# Patient Record
Sex: Male | Born: 1945
Health system: Southern US, Community
[De-identification: ages and names within clinical notes are randomized; demographics above are authoritative.]

---

## 2008-07-04 ENCOUNTER — Ambulatory Visit (HOSPITAL_COMMUNITY): Admission: RE | Admit: 2008-07-04 | Discharge: 2008-07-05 | Payer: Self-pay | Admitting: Ophthalmology

## 2010-01-07 IMAGING — CR DG CHEST 2V
2 series · 2 of 2 positions shown · non-contrast
Comparison: None

CLINICAL DATA: Glaucoma with high intraocular pressure.  Diabetes
and hypertension.  Preop respiratory exam.

CHEST - 2 VIEW

[view not recorded (1 of 2)]
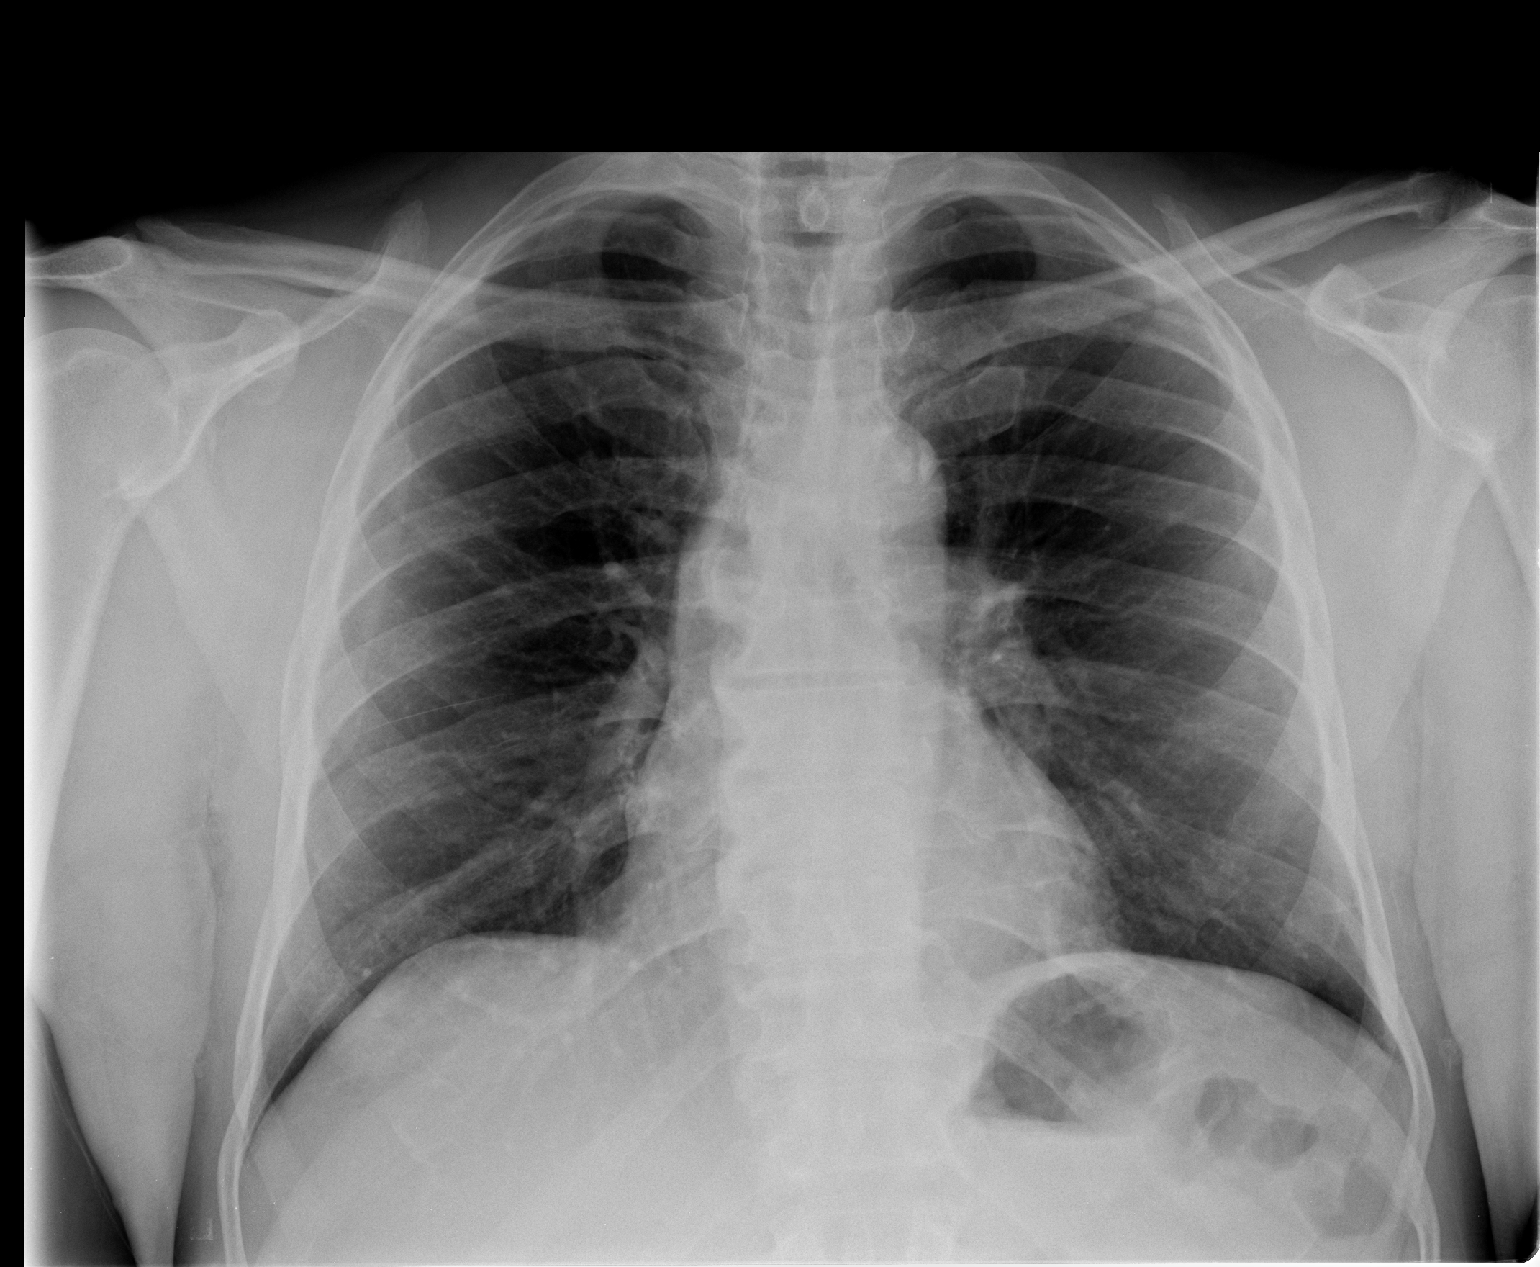

[view not recorded (2 of 2)]
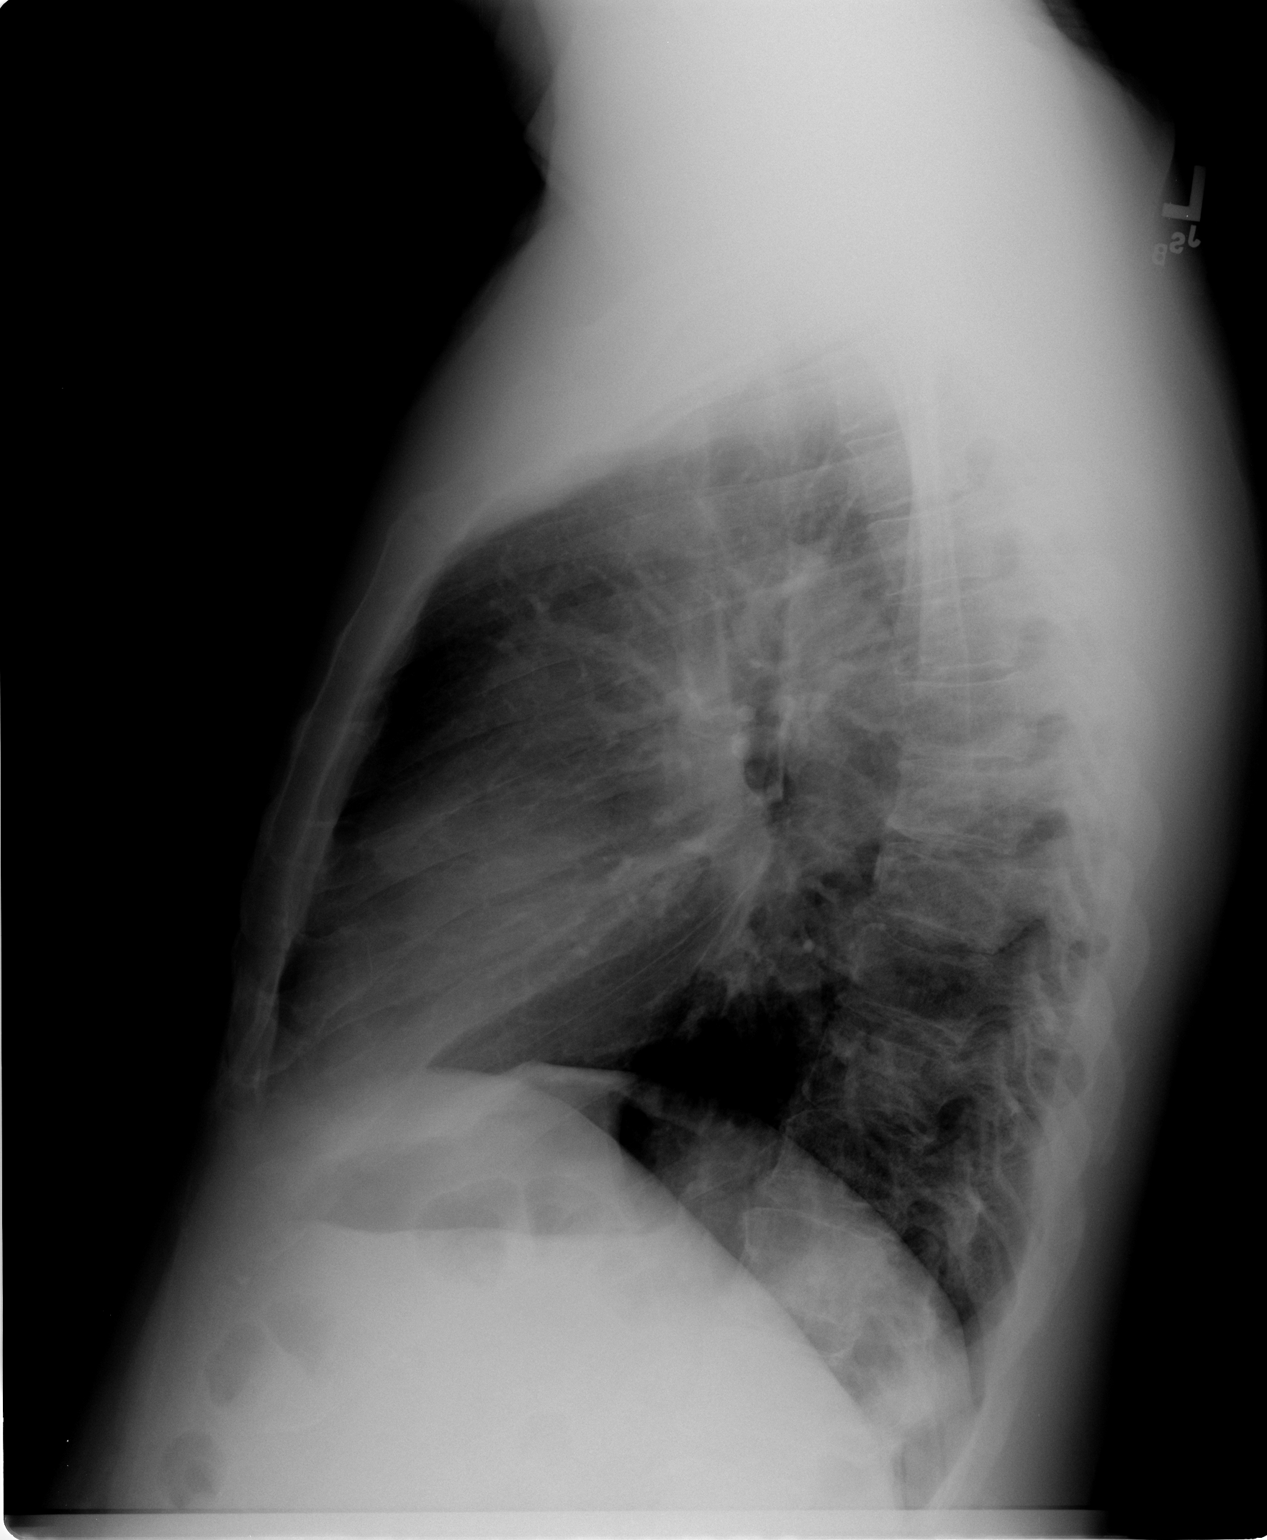

[2 of 2 positions shown; findings below may reference images not displayed]

FINDINGS: Heart size and mediastinal contours are normal.  Both
lungs are clear.  There is no evidence of pleural effusion.  No
mass or adenopathy identified.
IMPRESSION: No active disease.

## 2011-02-16 NOTE — Op Note (Signed)
NAMEJOAH, Devon Perez                  ACCOUNT NO.:  [id number]   MEDICAL RECORD NO.:  [id number]          PATIENT TYPE:  AMB   LOCATION:  SDS                          FACILITY:  [facility]   PHYSICIAN:  Chalmers Guest, M.D.     DATE OF BIRTH:  Mar 23, 1946   DATE OF PROCEDURE:  07/04/2008  DATE OF DISCHARGE:                               OPERATIVE REPORT   PREOPERATIVE DIAGNOSIS:  Uncontrolled glaucoma and visually significant  cataract, right eye.   POSTOPERATIVE DIAGNOSIS:  Uncontrolled glaucoma and visually significant  cataract, right eye.  The patient's right eye is his only seeing eye.  He has a prosthetic left eye.   PROCEDURE:  Mini Ex-PRESS glaucoma shunt with mitomycin-C and a  phacoemulsification with intraocular lens implant, right eye.   COMPLICATIONS:  None.   ANESTHESIA:  Consisted of 2% Xylocaine in a 50:50 mixture with 0.75%  Marcaine with an ampule of Wydase and epinephrine.   PROCEDURE IN DETAIL:  The patient was transported to the operating room  where a peribulbar block was given with the aforementioned local  anesthetic agent.  Following this, the patient's face was prepped and  draped in the usual sterile fashion with the surgeon sitting temporally.  The Weck-cel sponge was used to fixate the globe and a 15-degree blade  was used to enter through clear cornea at the 11 o'clock position.  Viscoat was then injected in the eye and additional Weck-cel sponge was  used to fixate the globe and a 2.75 mm keratome blade was used in a  stepwise fashion through the clear cornea temporally.  Additional  viscoelastic was injected.  Following this, a bent 25-gauge needle was  used to incise the anterior capsule and a curvilinear capsulorrhexis was  performed.  BSS was used to hydrodissect the nucleus and hydrodelineate  the nucleus.  The nucleus partly prolapsed out of the capsular bag.  The  phacoemulsification unit was then used to sculpt a pole and then the  epinuclear shell  was removed from the eye with the posterior capsule  remaining intact.  The I/A was then used to strip the cortical fibers  and the epinucleus from the rest of the remaining cortical fibers from  the posterior capsule and olive-tipped polisher was used to polish the  posterior capsule.  Following this, the intraocular lens implant was  examined and noted to have no defects.  The lens was an Alcon AcrySof  SN60WF 18.5 diopter lens.  It was placed in the lens shooter.  The  incision had to be slightly enlarged and then the lens shooter was  placed in the incision and the lens was injected into the eye.  It  unfolded behind the anterior capsular leaves.  The Kuglen hook was used  to position the lens.  The I/A was used to remove viscoelastic and  remaining cortical particles from the eye.  A single 10-0 nylon suture  was used to secure the incision that was buried in the clear cornea.  Following this, the operating microscope was positioned superiorly with  the surgeon rotating to the  superior position.  Using the [facility]  forceps, the conjunctiva was grasped and a blunt Westcott scissors were  used to incise the conjunctiva in the superior nasal quadrant.  Forming  a fornix-based conjunctival flap, blunt dissection was carried out  posteriorly.  A Tooke blade was used to resect the Tenon tissue.  There  was bleeding which was controlled with cautery.  Following this, a 45-  degree blade was used to fashion a half-thickness scleral flap.  The  0.12 was used to elevate the tips of the flap and Colibri forceps were  used.  A 5700 Grieshaber blade was used to dissect the scleral flap to  the limbus.  Bleeding again was controlled with cautery.  Following  this, mitomycin-C 0.4 mg/mL was placed on a Gelfoam sponge which was  placed under the conjunctiva and allowed to stay on the eye for 2.5  minutes.  It was then removed and irrigated with 60 mL of balanced salt  solution.  At this point, a  26-gauge needle on Provisc was then injected  in the eye.  After additional Provisc had been injected through the  paracentesis tract, using the 26-gauge needle under the scleral flap at  the limbus, the needle entered the eye and a small amount of Provisc was  injected.  This was removed and the mini shunt was then examined.  The  shunt was loosened on the wire.  Following this, the shunt which was Ex-  PRESS preloaded on EDSD-50, lot [id number], SA #[id number].  The shunt was  rotated and passed under the scleral flap through the stab incision that  had been made with the 26-gauge needle.  The shunt was placed and using  the forceps to fixate it, it was released easily.  The scleral flap was  then sutured with 4 interrupted 10-0 nylon sutures.  Following this, the  conjunctiva was resutured to the limbus using a 9-0 nylon on a BV-100  needle.  It was secured on both corners and sutured tightly.  BSS was  injected through the paracentesis site to burp out the remaining Provisc  and viscoelastic from the eye.  The incision was checked for leakage and  there was no leakage noted.  A subconjunctival injection of Kenalog 4 mg  was given at the 5:30 position.  Topical TobraDex ointment was applied  to the eye.  A patch and Fox shield were placed and the patient returned  to the recovery area in stable condition.      Chalmers Guest, M.D.  Electronically Signed     RW/MEDQ  D:  07/04/2008  T:  07/04/2008  Job:  [id number]   cc:   Fax #:  [phone]

## 2011-07-05 LAB — BASIC METABOLIC PANEL
BUN: 13
Calcium: 9.8
Creatinine, Ser: 1.1
GFR calc Af Amer: >60
GFR calc non Af Amer: >60

## 2011-07-05 LAB — CBC
MCV: 78.6
Platelets: 252
WBC: 5.8

## 2011-07-06 LAB — GLUCOSE, CAPILLARY
Glucose-Capillary: 108 — ABNORMAL HIGH
Glucose-Capillary: 125 — ABNORMAL HIGH
Glucose-Capillary: 131 — ABNORMAL HIGH
Glucose-Capillary: 215 — ABNORMAL HIGH
Glucose-Capillary: 239 — ABNORMAL HIGH

## 2015-10-17 DIAGNOSIS — I129 Hypertensive chronic kidney disease with stage 1 through stage 4 chronic kidney disease, or unspecified chronic kidney disease: Secondary | ICD-10-CM | POA: Diagnosis not present

## 2015-10-17 DIAGNOSIS — Z1211 Encounter for screening for malignant neoplasm of colon: Secondary | ICD-10-CM | POA: Diagnosis not present

## 2015-10-17 DIAGNOSIS — Z23 Encounter for immunization: Secondary | ICD-10-CM | POA: Diagnosis not present

## 2015-10-17 DIAGNOSIS — N183 Chronic kidney disease, stage 3 (moderate): Secondary | ICD-10-CM | POA: Diagnosis not present

## 2015-10-17 DIAGNOSIS — E559 Vitamin D deficiency, unspecified: Secondary | ICD-10-CM | POA: Diagnosis not present

## 2015-10-17 DIAGNOSIS — E785 Hyperlipidemia, unspecified: Secondary | ICD-10-CM | POA: Diagnosis not present

## 2015-10-17 DIAGNOSIS — Z136 Encounter for screening for cardiovascular disorders: Secondary | ICD-10-CM | POA: Diagnosis not present

## 2015-10-17 DIAGNOSIS — M109 Gout, unspecified: Secondary | ICD-10-CM | POA: Diagnosis not present

## 2015-10-17 DIAGNOSIS — Z6835 Body mass index (BMI) 35.0-35.9, adult: Secondary | ICD-10-CM | POA: Diagnosis not present

## 2015-10-17 DIAGNOSIS — Z0000 Encounter for general adult medical examination without abnormal findings: Secondary | ICD-10-CM | POA: Diagnosis not present

## 2015-10-17 DIAGNOSIS — E669 Obesity, unspecified: Secondary | ICD-10-CM | POA: Diagnosis not present

## 2015-10-17 DIAGNOSIS — E1122 Type 2 diabetes mellitus with diabetic chronic kidney disease: Secondary | ICD-10-CM | POA: Diagnosis not present

## 2015-10-17 DIAGNOSIS — Z79899 Other long term (current) drug therapy: Secondary | ICD-10-CM | POA: Diagnosis not present

## 2015-10-17 DIAGNOSIS — Z125 Encounter for screening for malignant neoplasm of prostate: Secondary | ICD-10-CM | POA: Diagnosis not present

## 2015-10-21 DIAGNOSIS — E785 Hyperlipidemia, unspecified: Secondary | ICD-10-CM | POA: Diagnosis not present

## 2015-10-21 DIAGNOSIS — M109 Gout, unspecified: Secondary | ICD-10-CM | POA: Diagnosis not present

## 2015-10-21 DIAGNOSIS — E1122 Type 2 diabetes mellitus with diabetic chronic kidney disease: Secondary | ICD-10-CM | POA: Diagnosis not present

## 2015-10-21 DIAGNOSIS — N183 Chronic kidney disease, stage 3 (moderate): Secondary | ICD-10-CM | POA: Diagnosis not present

## 2015-10-21 DIAGNOSIS — H540 Blindness, both eyes: Secondary | ICD-10-CM | POA: Diagnosis not present

## 2015-10-21 DIAGNOSIS — I129 Hypertensive chronic kidney disease with stage 1 through stage 4 chronic kidney disease, or unspecified chronic kidney disease: Secondary | ICD-10-CM | POA: Diagnosis not present

## 2015-10-21 DIAGNOSIS — Z0000 Encounter for general adult medical examination without abnormal findings: Secondary | ICD-10-CM | POA: Diagnosis not present

## 2015-10-21 DIAGNOSIS — E669 Obesity, unspecified: Secondary | ICD-10-CM | POA: Diagnosis not present

## 2015-10-27 DIAGNOSIS — E113291 Type 2 diabetes mellitus with mild nonproliferative diabetic retinopathy without macular edema, right eye: Secondary | ICD-10-CM | POA: Diagnosis not present

## 2015-10-27 DIAGNOSIS — H26491 Other secondary cataract, right eye: Secondary | ICD-10-CM | POA: Diagnosis not present

## 2015-10-27 DIAGNOSIS — H401113 Primary open-angle glaucoma, right eye, severe stage: Secondary | ICD-10-CM | POA: Diagnosis not present

## 2015-12-09 DIAGNOSIS — M65342 Trigger finger, left ring finger: Secondary | ICD-10-CM | POA: Diagnosis not present

## 2015-12-22 DIAGNOSIS — H401113 Primary open-angle glaucoma, right eye, severe stage: Secondary | ICD-10-CM | POA: Diagnosis not present

## 2016-01-19 DIAGNOSIS — I129 Hypertensive chronic kidney disease with stage 1 through stage 4 chronic kidney disease, or unspecified chronic kidney disease: Secondary | ICD-10-CM | POA: Diagnosis not present

## 2016-01-19 DIAGNOSIS — N183 Chronic kidney disease, stage 3 (moderate): Secondary | ICD-10-CM | POA: Diagnosis not present

## 2016-01-26 DIAGNOSIS — I129 Hypertensive chronic kidney disease with stage 1 through stage 4 chronic kidney disease, or unspecified chronic kidney disease: Secondary | ICD-10-CM | POA: Diagnosis not present

## 2016-01-26 DIAGNOSIS — Z794 Long term (current) use of insulin: Secondary | ICD-10-CM | POA: Diagnosis not present

## 2016-01-26 DIAGNOSIS — N183 Chronic kidney disease, stage 3 (moderate): Secondary | ICD-10-CM | POA: Diagnosis not present

## 2016-01-26 DIAGNOSIS — E1122 Type 2 diabetes mellitus with diabetic chronic kidney disease: Secondary | ICD-10-CM | POA: Diagnosis not present

## 2016-03-02 DIAGNOSIS — E1122 Type 2 diabetes mellitus with diabetic chronic kidney disease: Secondary | ICD-10-CM | POA: Diagnosis not present

## 2016-03-02 DIAGNOSIS — N183 Chronic kidney disease, stage 3 (moderate): Secondary | ICD-10-CM | POA: Diagnosis not present

## 2016-03-02 DIAGNOSIS — E782 Mixed hyperlipidemia: Secondary | ICD-10-CM | POA: Diagnosis not present

## 2016-03-02 DIAGNOSIS — N400 Enlarged prostate without lower urinary tract symptoms: Secondary | ICD-10-CM | POA: Diagnosis not present

## 2016-03-02 DIAGNOSIS — Z79899 Other long term (current) drug therapy: Secondary | ICD-10-CM | POA: Diagnosis not present

## 2016-03-02 DIAGNOSIS — M109 Gout, unspecified: Secondary | ICD-10-CM | POA: Diagnosis not present

## 2016-03-02 DIAGNOSIS — Z794 Long term (current) use of insulin: Secondary | ICD-10-CM | POA: Diagnosis not present

## 2016-03-02 DIAGNOSIS — I129 Hypertensive chronic kidney disease with stage 1 through stage 4 chronic kidney disease, or unspecified chronic kidney disease: Secondary | ICD-10-CM | POA: Diagnosis not present

## 2016-03-02 DIAGNOSIS — E559 Vitamin D deficiency, unspecified: Secondary | ICD-10-CM | POA: Diagnosis not present

## 2016-03-18 DIAGNOSIS — M109 Gout, unspecified: Secondary | ICD-10-CM | POA: Diagnosis not present

## 2016-06-03 DIAGNOSIS — I129 Hypertensive chronic kidney disease with stage 1 through stage 4 chronic kidney disease, or unspecified chronic kidney disease: Secondary | ICD-10-CM | POA: Diagnosis not present

## 2016-06-03 DIAGNOSIS — E559 Vitamin D deficiency, unspecified: Secondary | ICD-10-CM | POA: Diagnosis not present

## 2016-06-03 DIAGNOSIS — M1A9XX0 Chronic gout, unspecified, without tophus (tophi): Secondary | ICD-10-CM | POA: Diagnosis not present

## 2016-06-03 DIAGNOSIS — E782 Mixed hyperlipidemia: Secondary | ICD-10-CM | POA: Diagnosis not present

## 2016-06-03 DIAGNOSIS — Z87898 Personal history of other specified conditions: Secondary | ICD-10-CM | POA: Diagnosis not present

## 2016-06-03 DIAGNOSIS — E1122 Type 2 diabetes mellitus with diabetic chronic kidney disease: Secondary | ICD-10-CM | POA: Diagnosis not present

## 2016-06-03 DIAGNOSIS — N183 Chronic kidney disease, stage 3 (moderate): Secondary | ICD-10-CM | POA: Diagnosis not present

## 2016-06-03 DIAGNOSIS — Z794 Long term (current) use of insulin: Secondary | ICD-10-CM | POA: Diagnosis not present

## 2016-06-03 DIAGNOSIS — N400 Enlarged prostate without lower urinary tract symptoms: Secondary | ICD-10-CM | POA: Diagnosis not present

## 2016-06-14 DIAGNOSIS — H401113 Primary open-angle glaucoma, right eye, severe stage: Secondary | ICD-10-CM | POA: Diagnosis not present

## 2016-08-05 DIAGNOSIS — H401113 Primary open-angle glaucoma, right eye, severe stage: Secondary | ICD-10-CM | POA: Diagnosis not present

## 2016-08-05 DIAGNOSIS — E119 Type 2 diabetes mellitus without complications: Secondary | ICD-10-CM | POA: Diagnosis not present

## 2016-08-18 DIAGNOSIS — H409 Unspecified glaucoma: Secondary | ICD-10-CM | POA: Diagnosis not present

## 2016-08-18 DIAGNOSIS — H401113 Primary open-angle glaucoma, right eye, severe stage: Secondary | ICD-10-CM | POA: Diagnosis not present

## 2016-09-29 DIAGNOSIS — N183 Chronic kidney disease, stage 3 (moderate): Secondary | ICD-10-CM | POA: Diagnosis not present

## 2016-09-29 DIAGNOSIS — E559 Vitamin D deficiency, unspecified: Secondary | ICD-10-CM | POA: Diagnosis not present

## 2016-09-29 DIAGNOSIS — M109 Gout, unspecified: Secondary | ICD-10-CM | POA: Diagnosis not present

## 2016-09-29 DIAGNOSIS — I129 Hypertensive chronic kidney disease with stage 1 through stage 4 chronic kidney disease, or unspecified chronic kidney disease: Secondary | ICD-10-CM | POA: Diagnosis not present

## 2016-09-29 DIAGNOSIS — Z23 Encounter for immunization: Secondary | ICD-10-CM | POA: Diagnosis not present

## 2016-09-29 DIAGNOSIS — N400 Enlarged prostate without lower urinary tract symptoms: Secondary | ICD-10-CM | POA: Diagnosis not present

## 2016-09-29 DIAGNOSIS — E782 Mixed hyperlipidemia: Secondary | ICD-10-CM | POA: Diagnosis not present

## 2016-09-29 DIAGNOSIS — E1122 Type 2 diabetes mellitus with diabetic chronic kidney disease: Secondary | ICD-10-CM | POA: Diagnosis not present

## 2016-09-29 DIAGNOSIS — Z794 Long term (current) use of insulin: Secondary | ICD-10-CM | POA: Diagnosis not present

## 2016-10-13 DIAGNOSIS — M79645 Pain in left finger(s): Secondary | ICD-10-CM | POA: Diagnosis not present

## 2016-10-13 DIAGNOSIS — M65312 Trigger thumb, left thumb: Secondary | ICD-10-CM | POA: Diagnosis not present

## 2016-12-09 DIAGNOSIS — H401113 Primary open-angle glaucoma, right eye, severe stage: Secondary | ICD-10-CM | POA: Diagnosis not present

## 2016-12-28 DIAGNOSIS — K1379 Other lesions of oral mucosa: Secondary | ICD-10-CM | POA: Diagnosis not present

## 2017-01-06 DIAGNOSIS — M109 Gout, unspecified: Secondary | ICD-10-CM | POA: Diagnosis not present

## 2017-01-06 DIAGNOSIS — Z794 Long term (current) use of insulin: Secondary | ICD-10-CM | POA: Diagnosis not present

## 2017-01-06 DIAGNOSIS — E1122 Type 2 diabetes mellitus with diabetic chronic kidney disease: Secondary | ICD-10-CM | POA: Diagnosis not present

## 2017-01-06 DIAGNOSIS — Z1389 Encounter for screening for other disorder: Secondary | ICD-10-CM | POA: Diagnosis not present

## 2017-01-06 DIAGNOSIS — E559 Vitamin D deficiency, unspecified: Secondary | ICD-10-CM | POA: Diagnosis not present

## 2017-01-06 DIAGNOSIS — Z9181 History of falling: Secondary | ICD-10-CM | POA: Diagnosis not present

## 2017-01-06 DIAGNOSIS — E669 Obesity, unspecified: Secondary | ICD-10-CM | POA: Diagnosis not present

## 2017-01-06 DIAGNOSIS — N183 Chronic kidney disease, stage 3 (moderate): Secondary | ICD-10-CM | POA: Diagnosis not present

## 2017-01-06 DIAGNOSIS — E7800 Pure hypercholesterolemia, unspecified: Secondary | ICD-10-CM | POA: Diagnosis not present

## 2017-01-06 DIAGNOSIS — Z1211 Encounter for screening for malignant neoplasm of colon: Secondary | ICD-10-CM | POA: Diagnosis not present

## 2017-01-06 DIAGNOSIS — Z125 Encounter for screening for malignant neoplasm of prostate: Secondary | ICD-10-CM | POA: Diagnosis not present

## 2017-01-06 DIAGNOSIS — Z0000 Encounter for general adult medical examination without abnormal findings: Secondary | ICD-10-CM | POA: Diagnosis not present

## 2017-01-06 DIAGNOSIS — Z1212 Encounter for screening for malignant neoplasm of rectum: Secondary | ICD-10-CM | POA: Diagnosis not present

## 2017-01-10 DIAGNOSIS — H401113 Primary open-angle glaucoma, right eye, severe stage: Secondary | ICD-10-CM | POA: Diagnosis not present

## 2017-01-20 DIAGNOSIS — N183 Chronic kidney disease, stage 3 (moderate): Secondary | ICD-10-CM | POA: Diagnosis not present

## 2017-01-20 DIAGNOSIS — E782 Mixed hyperlipidemia: Secondary | ICD-10-CM | POA: Diagnosis not present

## 2017-01-20 DIAGNOSIS — M109 Gout, unspecified: Secondary | ICD-10-CM | POA: Diagnosis not present

## 2017-01-20 DIAGNOSIS — N400 Enlarged prostate without lower urinary tract symptoms: Secondary | ICD-10-CM | POA: Diagnosis not present

## 2017-01-20 DIAGNOSIS — I129 Hypertensive chronic kidney disease with stage 1 through stage 4 chronic kidney disease, or unspecified chronic kidney disease: Secondary | ICD-10-CM | POA: Diagnosis not present

## 2017-01-20 DIAGNOSIS — E559 Vitamin D deficiency, unspecified: Secondary | ICD-10-CM | POA: Diagnosis not present

## 2017-01-20 DIAGNOSIS — Z794 Long term (current) use of insulin: Secondary | ICD-10-CM | POA: Diagnosis not present

## 2017-01-20 DIAGNOSIS — E1122 Type 2 diabetes mellitus with diabetic chronic kidney disease: Secondary | ICD-10-CM | POA: Diagnosis not present

## 2017-03-16 DIAGNOSIS — Z794 Long term (current) use of insulin: Secondary | ICD-10-CM | POA: Diagnosis not present

## 2017-03-16 DIAGNOSIS — N183 Chronic kidney disease, stage 3 (moderate): Secondary | ICD-10-CM | POA: Diagnosis not present

## 2017-03-16 DIAGNOSIS — E1122 Type 2 diabetes mellitus with diabetic chronic kidney disease: Secondary | ICD-10-CM | POA: Diagnosis not present

## 2017-03-16 DIAGNOSIS — I129 Hypertensive chronic kidney disease with stage 1 through stage 4 chronic kidney disease, or unspecified chronic kidney disease: Secondary | ICD-10-CM | POA: Diagnosis not present

## 2017-03-22 DIAGNOSIS — H401113 Primary open-angle glaucoma, right eye, severe stage: Secondary | ICD-10-CM | POA: Diagnosis not present

## 2017-05-11 DIAGNOSIS — Z794 Long term (current) use of insulin: Secondary | ICD-10-CM | POA: Diagnosis not present

## 2017-05-11 DIAGNOSIS — E1122 Type 2 diabetes mellitus with diabetic chronic kidney disease: Secondary | ICD-10-CM | POA: Diagnosis not present

## 2017-05-11 DIAGNOSIS — M109 Gout, unspecified: Secondary | ICD-10-CM | POA: Diagnosis not present

## 2017-05-11 DIAGNOSIS — I129 Hypertensive chronic kidney disease with stage 1 through stage 4 chronic kidney disease, or unspecified chronic kidney disease: Secondary | ICD-10-CM | POA: Diagnosis not present

## 2017-05-11 DIAGNOSIS — N400 Enlarged prostate without lower urinary tract symptoms: Secondary | ICD-10-CM | POA: Diagnosis not present

## 2017-05-11 DIAGNOSIS — N183 Chronic kidney disease, stage 3 (moderate): Secondary | ICD-10-CM | POA: Diagnosis not present

## 2017-05-11 DIAGNOSIS — E559 Vitamin D deficiency, unspecified: Secondary | ICD-10-CM | POA: Diagnosis not present

## 2017-05-11 DIAGNOSIS — E782 Mixed hyperlipidemia: Secondary | ICD-10-CM | POA: Diagnosis not present

## 2017-05-16 DIAGNOSIS — H401113 Primary open-angle glaucoma, right eye, severe stage: Secondary | ICD-10-CM | POA: Diagnosis not present

## 2017-06-14 DIAGNOSIS — E1122 Type 2 diabetes mellitus with diabetic chronic kidney disease: Secondary | ICD-10-CM | POA: Diagnosis not present

## 2017-06-14 DIAGNOSIS — I129 Hypertensive chronic kidney disease with stage 1 through stage 4 chronic kidney disease, or unspecified chronic kidney disease: Secondary | ICD-10-CM | POA: Diagnosis not present

## 2017-06-14 DIAGNOSIS — N183 Chronic kidney disease, stage 3 (moderate): Secondary | ICD-10-CM | POA: Diagnosis not present

## 2017-06-14 DIAGNOSIS — Z794 Long term (current) use of insulin: Secondary | ICD-10-CM | POA: Diagnosis not present

## 2017-08-23 DIAGNOSIS — H401113 Primary open-angle glaucoma, right eye, severe stage: Secondary | ICD-10-CM | POA: Diagnosis not present

## 2017-09-14 DIAGNOSIS — I129 Hypertensive chronic kidney disease with stage 1 through stage 4 chronic kidney disease, or unspecified chronic kidney disease: Secondary | ICD-10-CM | POA: Diagnosis not present

## 2017-09-14 DIAGNOSIS — E1122 Type 2 diabetes mellitus with diabetic chronic kidney disease: Secondary | ICD-10-CM | POA: Diagnosis not present

## 2017-09-14 DIAGNOSIS — Z794 Long term (current) use of insulin: Secondary | ICD-10-CM | POA: Diagnosis not present

## 2017-09-14 DIAGNOSIS — E782 Mixed hyperlipidemia: Secondary | ICD-10-CM | POA: Diagnosis not present

## 2017-09-14 DIAGNOSIS — N183 Chronic kidney disease, stage 3 (moderate): Secondary | ICD-10-CM | POA: Diagnosis not present

## 2017-09-14 DIAGNOSIS — E669 Obesity, unspecified: Secondary | ICD-10-CM | POA: Diagnosis not present

## 2017-09-14 DIAGNOSIS — M109 Gout, unspecified: Secondary | ICD-10-CM | POA: Diagnosis not present

## 2017-09-14 DIAGNOSIS — N400 Enlarged prostate without lower urinary tract symptoms: Secondary | ICD-10-CM | POA: Diagnosis not present

## 2017-09-14 DIAGNOSIS — E559 Vitamin D deficiency, unspecified: Secondary | ICD-10-CM | POA: Diagnosis not present

## 2017-09-15 DIAGNOSIS — E871 Hypo-osmolality and hyponatremia: Secondary | ICD-10-CM | POA: Diagnosis not present

## 2017-09-16 DIAGNOSIS — E871 Hypo-osmolality and hyponatremia: Secondary | ICD-10-CM | POA: Diagnosis not present

## 2017-11-24 DIAGNOSIS — H40151 Residual stage of open-angle glaucoma, right eye: Secondary | ICD-10-CM | POA: Diagnosis not present

## 2017-12-08 DIAGNOSIS — N183 Chronic kidney disease, stage 3 (moderate): Secondary | ICD-10-CM | POA: Diagnosis not present

## 2017-12-12 DIAGNOSIS — N183 Chronic kidney disease, stage 3 (moderate): Secondary | ICD-10-CM | POA: Diagnosis not present

## 2017-12-12 DIAGNOSIS — I129 Hypertensive chronic kidney disease with stage 1 through stage 4 chronic kidney disease, or unspecified chronic kidney disease: Secondary | ICD-10-CM | POA: Diagnosis not present

## 2017-12-12 DIAGNOSIS — Z794 Long term (current) use of insulin: Secondary | ICD-10-CM | POA: Diagnosis not present

## 2017-12-12 DIAGNOSIS — E1122 Type 2 diabetes mellitus with diabetic chronic kidney disease: Secondary | ICD-10-CM | POA: Diagnosis not present

## 2018-02-01 DIAGNOSIS — N183 Chronic kidney disease, stage 3 (moderate): Secondary | ICD-10-CM | POA: Diagnosis not present

## 2018-02-01 DIAGNOSIS — Z794 Long term (current) use of insulin: Secondary | ICD-10-CM | POA: Diagnosis not present

## 2018-02-01 DIAGNOSIS — E1122 Type 2 diabetes mellitus with diabetic chronic kidney disease: Secondary | ICD-10-CM | POA: Diagnosis not present

## 2018-02-01 DIAGNOSIS — Z125 Encounter for screening for malignant neoplasm of prostate: Secondary | ICD-10-CM | POA: Diagnosis not present

## 2018-02-01 DIAGNOSIS — I129 Hypertensive chronic kidney disease with stage 1 through stage 4 chronic kidney disease, or unspecified chronic kidney disease: Secondary | ICD-10-CM | POA: Diagnosis not present

## 2018-03-01 DIAGNOSIS — E349 Endocrine disorder, unspecified: Secondary | ICD-10-CM | POA: Diagnosis not present

## 2018-03-01 DIAGNOSIS — N179 Acute kidney failure, unspecified: Secondary | ICD-10-CM | POA: Diagnosis not present

## 2018-03-01 DIAGNOSIS — Z743 Need for continuous supervision: Secondary | ICD-10-CM | POA: Diagnosis not present

## 2018-03-01 DIAGNOSIS — I517 Cardiomegaly: Secondary | ICD-10-CM | POA: Diagnosis not present

## 2018-03-01 DIAGNOSIS — L89152 Pressure ulcer of sacral region, stage 2: Secondary | ICD-10-CM | POA: Diagnosis not present

## 2018-03-01 DIAGNOSIS — E871 Hypo-osmolality and hyponatremia: Secondary | ICD-10-CM | POA: Diagnosis not present

## 2018-03-01 DIAGNOSIS — E1110 Type 2 diabetes mellitus with ketoacidosis without coma: Secondary | ICD-10-CM | POA: Diagnosis not present

## 2018-03-01 DIAGNOSIS — F329 Major depressive disorder, single episode, unspecified: Secondary | ICD-10-CM | POA: Diagnosis not present

## 2018-03-01 DIAGNOSIS — R5381 Other malaise: Secondary | ICD-10-CM | POA: Diagnosis not present

## 2018-03-01 DIAGNOSIS — E861 Hypovolemia: Secondary | ICD-10-CM | POA: Diagnosis not present

## 2018-03-01 DIAGNOSIS — I129 Hypertensive chronic kidney disease with stage 1 through stage 4 chronic kidney disease, or unspecified chronic kidney disease: Secondary | ICD-10-CM | POA: Diagnosis not present

## 2018-03-01 DIAGNOSIS — R9431 Abnormal electrocardiogram [ECG] [EKG]: Secondary | ICD-10-CM | POA: Diagnosis not present

## 2018-03-01 DIAGNOSIS — Z794 Long term (current) use of insulin: Secondary | ICD-10-CM | POA: Diagnosis not present

## 2018-03-01 DIAGNOSIS — N183 Chronic kidney disease, stage 3 (moderate): Secondary | ICD-10-CM | POA: Diagnosis not present

## 2018-03-01 DIAGNOSIS — I10 Essential (primary) hypertension: Secondary | ICD-10-CM | POA: Diagnosis not present

## 2018-03-01 DIAGNOSIS — R1310 Dysphagia, unspecified: Secondary | ICD-10-CM | POA: Diagnosis not present

## 2018-03-01 DIAGNOSIS — R279 Unspecified lack of coordination: Secondary | ICD-10-CM | POA: Diagnosis not present

## 2018-03-01 DIAGNOSIS — E1010 Type 1 diabetes mellitus with ketoacidosis without coma: Secondary | ICD-10-CM | POA: Diagnosis not present

## 2018-03-01 DIAGNOSIS — Z87891 Personal history of nicotine dependence: Secondary | ICD-10-CM | POA: Diagnosis not present

## 2018-03-01 DIAGNOSIS — E1122 Type 2 diabetes mellitus with diabetic chronic kidney disease: Secondary | ICD-10-CM | POA: Diagnosis not present

## 2018-03-01 DIAGNOSIS — H548 Legal blindness, as defined in USA: Secondary | ICD-10-CM | POA: Diagnosis not present

## 2018-03-01 DIAGNOSIS — H409 Unspecified glaucoma: Secondary | ICD-10-CM | POA: Diagnosis not present

## 2018-03-01 DIAGNOSIS — E1022 Type 1 diabetes mellitus with diabetic chronic kidney disease: Secondary | ICD-10-CM | POA: Diagnosis not present

## 2018-03-01 DIAGNOSIS — Z970 Presence of artificial eye: Secondary | ICD-10-CM | POA: Diagnosis not present

## 2018-03-01 DIAGNOSIS — E860 Dehydration: Secondary | ICD-10-CM | POA: Diagnosis not present

## 2018-03-01 DIAGNOSIS — E119 Type 2 diabetes mellitus without complications: Secondary | ICD-10-CM | POA: Diagnosis not present

## 2018-03-01 DIAGNOSIS — E785 Hyperlipidemia, unspecified: Secondary | ICD-10-CM | POA: Diagnosis not present

## 2018-03-01 DIAGNOSIS — D72829 Elevated white blood cell count, unspecified: Secondary | ICD-10-CM | POA: Diagnosis not present

## 2018-03-07 DIAGNOSIS — I1310 Hypertensive heart and chronic kidney disease without heart failure, with stage 1 through stage 4 chronic kidney disease, or unspecified chronic kidney disease: Secondary | ICD-10-CM | POA: Diagnosis not present

## 2018-03-07 DIAGNOSIS — I129 Hypertensive chronic kidney disease with stage 1 through stage 4 chronic kidney disease, or unspecified chronic kidney disease: Secondary | ICD-10-CM | POA: Diagnosis not present

## 2018-03-07 DIAGNOSIS — E119 Type 2 diabetes mellitus without complications: Secondary | ICD-10-CM | POA: Diagnosis not present

## 2018-03-07 DIAGNOSIS — Z794 Long term (current) use of insulin: Secondary | ICD-10-CM | POA: Diagnosis not present

## 2018-03-07 DIAGNOSIS — Z743 Need for continuous supervision: Secondary | ICD-10-CM | POA: Diagnosis not present

## 2018-03-07 DIAGNOSIS — E1122 Type 2 diabetes mellitus with diabetic chronic kidney disease: Secondary | ICD-10-CM | POA: Diagnosis not present

## 2018-03-07 DIAGNOSIS — R262 Difficulty in walking, not elsewhere classified: Secondary | ICD-10-CM | POA: Diagnosis not present

## 2018-03-07 DIAGNOSIS — E1152 Type 2 diabetes mellitus with diabetic peripheral angiopathy with gangrene: Secondary | ICD-10-CM | POA: Diagnosis not present

## 2018-03-07 DIAGNOSIS — R279 Unspecified lack of coordination: Secondary | ICD-10-CM | POA: Diagnosis not present

## 2018-03-07 DIAGNOSIS — I10 Essential (primary) hypertension: Secondary | ICD-10-CM | POA: Diagnosis not present

## 2018-03-07 DIAGNOSIS — R269 Unspecified abnormalities of gait and mobility: Secondary | ICD-10-CM | POA: Diagnosis not present

## 2018-03-07 DIAGNOSIS — E1110 Type 2 diabetes mellitus with ketoacidosis without coma: Secondary | ICD-10-CM | POA: Diagnosis not present

## 2018-03-07 DIAGNOSIS — N183 Chronic kidney disease, stage 3 (moderate): Secondary | ICD-10-CM | POA: Diagnosis not present

## 2018-03-08 DIAGNOSIS — E1152 Type 2 diabetes mellitus with diabetic peripheral angiopathy with gangrene: Secondary | ICD-10-CM | POA: Diagnosis not present

## 2018-03-08 DIAGNOSIS — I1310 Hypertensive heart and chronic kidney disease without heart failure, with stage 1 through stage 4 chronic kidney disease, or unspecified chronic kidney disease: Secondary | ICD-10-CM | POA: Diagnosis not present

## 2018-03-08 DIAGNOSIS — R262 Difficulty in walking, not elsewhere classified: Secondary | ICD-10-CM | POA: Diagnosis not present

## 2018-03-08 DIAGNOSIS — N183 Chronic kidney disease, stage 3 (moderate): Secondary | ICD-10-CM | POA: Diagnosis not present

## 2018-03-28 ENCOUNTER — Other Ambulatory Visit: Payer: Self-pay

## 2018-03-29 ENCOUNTER — Other Ambulatory Visit: Payer: Self-pay

## 2018-03-29 DIAGNOSIS — R402441 Other coma, without documented Glasgow coma scale score, or with partial score reported, in the field [EMT or ambulance]: Secondary | ICD-10-CM | POA: Diagnosis not present

## 2018-03-29 DIAGNOSIS — R4182 Altered mental status, unspecified: Secondary | ICD-10-CM | POA: Diagnosis not present

## 2018-03-29 DIAGNOSIS — E876 Hypokalemia: Secondary | ICD-10-CM | POA: Diagnosis not present

## 2018-03-29 DIAGNOSIS — E162 Hypoglycemia, unspecified: Secondary | ICD-10-CM | POA: Diagnosis not present

## 2018-03-29 DIAGNOSIS — E161 Other hypoglycemia: Secondary | ICD-10-CM | POA: Diagnosis not present

## 2018-03-29 DIAGNOSIS — E10649 Type 1 diabetes mellitus with hypoglycemia without coma: Secondary | ICD-10-CM | POA: Diagnosis not present

## 2018-03-29 DIAGNOSIS — R4020 Unspecified coma: Secondary | ICD-10-CM | POA: Diagnosis not present

## 2018-03-29 NOTE — Patient Outreach (Signed)
[facility]N) Care Management  03/29/2018  Marion DownerJames C Sawchuk 11/28/1945 [id number]  Transition of care  Referral date: 03/29/18 Referral source: [facility]18 Insurance: Health team advantage   Telephone call to patient regarding transition of care referral. HIPAA verified with patient. Explained reason for call. Patient states he was recently in the hospital due to his blood sugar being really high.  Patient states he does not fully understand everything regarding his condition. Patient states the home health agency called and cancelled services for today. States the agency said they will start on tomorrow.  Patient states he does not remember the name of the agency.  Patient unable to review medications with RNCM. Patient states his brother manages his medication but is not at home at this time to review them.  Patient reports his brother had to call 911 for him on yesterday due to his blood sugar dropping to 42.  Patient states he did go to the emergency room but was released.  He reports he is checking his blood sugars 3 times per day. Patient report his blood sugar was 525 when he went in the hospital. RNCM discussed and offered [facility]. Patient verbally agreed to transition of care follow up with community case manager.   PLAN: RNCM will refer patient to community case manager.  George InaDavina Zaryiah Barz RN,BSN,CCM [facility] Telephonic  [phone]

## 2018-03-29 NOTE — Patient Outreach (Signed)
[facility]) Care Management  03/29/2018  Marion DownerJames C Lanpher 09/29/1946 [id number]  Late entry:for  03/28/18 Transition of care  Referral date: 03/28/18 Referral source: disharged from [facility] nursing home on 03/27/18 Insurance: Health team advantage Attempt #1  Telephone call to patient regarding referral. Unable to reach patient. HIPAA compliant voice message left with call back phone number.   PLAN: RNCM will attempt 2nd telephone call to patient within 4 business days. RNCM will send outreach letter.   George InaDavina Sherrill Mckamie RN,BSN,CCM [facility] Telephonic  [phone]

## 2018-03-30 DIAGNOSIS — Z794 Long term (current) use of insulin: Secondary | ICD-10-CM | POA: Diagnosis not present

## 2018-03-30 DIAGNOSIS — N183 Chronic kidney disease, stage 3 (moderate): Secondary | ICD-10-CM | POA: Diagnosis not present

## 2018-03-30 DIAGNOSIS — Z970 Presence of artificial eye: Secondary | ICD-10-CM | POA: Diagnosis not present

## 2018-03-30 DIAGNOSIS — H548 Legal blindness, as defined in USA: Secondary | ICD-10-CM | POA: Diagnosis not present

## 2018-03-30 DIAGNOSIS — I129 Hypertensive chronic kidney disease with stage 1 through stage 4 chronic kidney disease, or unspecified chronic kidney disease: Secondary | ICD-10-CM | POA: Diagnosis not present

## 2018-03-30 DIAGNOSIS — Z9181 History of falling: Secondary | ICD-10-CM | POA: Diagnosis not present

## 2018-03-30 DIAGNOSIS — H409 Unspecified glaucoma: Secondary | ICD-10-CM | POA: Diagnosis not present

## 2018-03-30 DIAGNOSIS — E1122 Type 2 diabetes mellitus with diabetic chronic kidney disease: Secondary | ICD-10-CM | POA: Diagnosis not present

## 2018-04-03 ENCOUNTER — Other Ambulatory Visit: Payer: Self-pay

## 2018-04-03 NOTE — Patient Outreach (Signed)
[facility]N) Care Management  04/03/2018  Marion DownerJames C Mincy 10/31/1945 [id number]  Transition of care  Referral date: 03/29/18 Referral source: [facility] 03/27/18 Insurance: Health team advantage   Telephone call to patient's brother/  Caregiver, [name] as requested by patient. Verbal consent given by patient to speak with his brother regarding his health information.  HIPAA verified by brother for patient. Explained reason for call.  Completed transition of care call.  RNCM advised patient is to notify MD of any changes in condition prior to scheduled appointment. RNCM provided contact name and number: [phone] or main office number [phone] and 24 hour nurse advise line [phone].  RNCM verified aware of 911 services for urgent/ emergent needs.  PLAN; RNCM will refer patient to community case manager   George InaDavina Kelyn Ponciano RN,BSN,CCM [facility] Telephonic  [phone]

## 2018-04-04 DIAGNOSIS — N183 Chronic kidney disease, stage 3 (moderate): Secondary | ICD-10-CM | POA: Diagnosis not present

## 2018-04-04 DIAGNOSIS — I129 Hypertensive chronic kidney disease with stage 1 through stage 4 chronic kidney disease, or unspecified chronic kidney disease: Secondary | ICD-10-CM | POA: Diagnosis not present

## 2018-04-04 DIAGNOSIS — E1122 Type 2 diabetes mellitus with diabetic chronic kidney disease: Secondary | ICD-10-CM | POA: Diagnosis not present

## 2018-04-04 DIAGNOSIS — H548 Legal blindness, as defined in USA: Secondary | ICD-10-CM | POA: Diagnosis not present

## 2018-04-10 ENCOUNTER — Other Ambulatory Visit: Payer: Self-pay

## 2018-04-10 NOTE — Patient Outreach (Signed)
Transition of care call:  Placed call to patient on home, cell with no answer. Placed call on phone number  [phone] ( alternative number). Patient answered and reports that he is feeling well. Reports CBG of 160 today. Reports that he is taking his medications as prescribed. States PT comes tomorrow.  Difficult hearing patient on the phone.  Patient handed phone to Mr. Rosilyn MingsKidd.  Mr. Rosilyn MingsKidd confirms new address of [address], [place] [id number].   Offered home visit for 04/12/2018 at 11am. Patient accepted. Confirmed [facility]    PLAN: Home visit for 04/12/2018 at 11am. Confirmed new address. Sent in basket message to update chart.  Will address care plan at home visit in 2 days.  Rowe PavyAmanda Marlicia Sroka, RN, BSN, CEN [facility] Care Coordinator [phone]

## 2018-04-12 ENCOUNTER — Other Ambulatory Visit: Payer: Self-pay

## 2018-04-12 NOTE — Patient Outreach (Signed)
[facility](THN) Care Management  04/12/2018  Devon Perez 07/11/1946 [id number]   11am Arrived for initial home visit. Brother, [name] present.  Explained [facility] program as a benefit of insurance.  Provided new patient packet.  Patient reports that he is doing well and does not need [facility] services. Brother agreed.   Respectful of patients decision. Encouraged brother and or patient to call me if their change their minds. Contact card provided.  Will close case as patient is no longer interested. Will send MD a letter to update.  Patient agreed to new patient packet and living well with diabetes booklet.   Rowe PavyAmanda Joslyne Marshburn, RN, BSN, CEN [facility] Care Coordinator [phone]

## 2018-05-04 DIAGNOSIS — I129 Hypertensive chronic kidney disease with stage 1 through stage 4 chronic kidney disease, or unspecified chronic kidney disease: Secondary | ICD-10-CM | POA: Diagnosis not present

## 2018-05-04 DIAGNOSIS — Z794 Long term (current) use of insulin: Secondary | ICD-10-CM | POA: Diagnosis not present

## 2018-05-04 DIAGNOSIS — E1122 Type 2 diabetes mellitus with diabetic chronic kidney disease: Secondary | ICD-10-CM | POA: Diagnosis not present

## 2018-05-04 DIAGNOSIS — N183 Chronic kidney disease, stage 3 (moderate): Secondary | ICD-10-CM | POA: Diagnosis not present

## 2018-06-06 DIAGNOSIS — Z794 Long term (current) use of insulin: Secondary | ICD-10-CM | POA: Diagnosis not present

## 2018-06-06 DIAGNOSIS — N183 Chronic kidney disease, stage 3 (moderate): Secondary | ICD-10-CM | POA: Diagnosis not present

## 2018-06-06 DIAGNOSIS — E1122 Type 2 diabetes mellitus with diabetic chronic kidney disease: Secondary | ICD-10-CM | POA: Diagnosis not present

## 2018-06-13 DIAGNOSIS — I129 Hypertensive chronic kidney disease with stage 1 through stage 4 chronic kidney disease, or unspecified chronic kidney disease: Secondary | ICD-10-CM | POA: Diagnosis not present

## 2018-06-13 DIAGNOSIS — N183 Chronic kidney disease, stage 3 (moderate): Secondary | ICD-10-CM | POA: Diagnosis not present

## 2018-06-13 DIAGNOSIS — Z794 Long term (current) use of insulin: Secondary | ICD-10-CM | POA: Diagnosis not present

## 2018-06-13 DIAGNOSIS — E1122 Type 2 diabetes mellitus with diabetic chronic kidney disease: Secondary | ICD-10-CM | POA: Diagnosis not present

## 2018-06-21 ENCOUNTER — Other Ambulatory Visit: Payer: Self-pay

## 2018-06-21 NOTE — Patient Outreach (Signed)
[facility](THN) Care Management  06/21/2018  Devon Perez 10/09/1945 [id number]   TELEPHONE SCREENING Referral date: 06/14/18 Referral source: HTA referral Referral reason: medication assistance Insurance: HTA Attempt #1   Telephone call to patient regarding referral. Unable to reach patient. HIPAA compliant voice message left with call back phone number.   PLAN: RNCM will attempt 2nd telephone call to patient within 4 business days. RNCM will send outreach letter.   George InaDavina Jaking Thayer RN,BSN, CCM [facility] Telephonic  [phone]

## 2018-06-27 ENCOUNTER — Other Ambulatory Visit: Payer: Self-pay

## 2018-06-27 NOTE — Patient Outreach (Addendum)
[facility]N) Care Management  06/27/2018  Devon DownerJames C Perez 09/16/1946 [id number]   TELEPHONE SCREENING Referral date: 06/14/18 Referral source: HTA referral Referral reason: medication assistance Insurance: HTA Attempt #2   Telephone call to patient regarding referral.  Attempted listed home and mobile number. Unable to reach patient. HIPAA compliant voice message left with call back phone number.   PLAN: RNCM will attempt 3rd telephone call to patient within 4 business days.    George InaDavina Servando Kyllonen RN,BSN, CCM [facility] Telephonic  [phone]

## 2018-06-29 DIAGNOSIS — E161 Other hypoglycemia: Secondary | ICD-10-CM | POA: Diagnosis not present

## 2018-06-29 DIAGNOSIS — R4182 Altered mental status, unspecified: Secondary | ICD-10-CM | POA: Diagnosis not present

## 2018-06-29 DIAGNOSIS — R402441 Other coma, without documented Glasgow coma scale score, or with partial score reported, in the field [EMT or ambulance]: Secondary | ICD-10-CM | POA: Diagnosis not present

## 2018-06-29 DIAGNOSIS — E162 Hypoglycemia, unspecified: Secondary | ICD-10-CM | POA: Diagnosis not present

## 2018-06-29 DIAGNOSIS — R4020 Unspecified coma: Secondary | ICD-10-CM | POA: Diagnosis not present

## 2018-06-30 ENCOUNTER — Other Ambulatory Visit: Payer: Self-pay

## 2018-06-30 NOTE — Patient Outreach (Signed)
[facility]) Care Management  06/30/2018  Devon Perez September 17, 1946 [id number]   TELEPHONE SCREENING Referral date:06/14/18 Referral source:HTA referral Referral reason:medication assistance Insurance:HTA Attempt #3  Telephone call to patient regarding referral. Attempted listed home number.  Unable to reach. HIPAA compliant message left with call back phone number.  Attempted listed  Mobile number.  Contact states patient does not live there. Contact gave alternate phone  Number for contact with patient.  Attempted alternate number for patient. Unable to reach or leave voice message. Message states mailbox has not been set up.   PLAN: Will attempt follow up call within 4 business days. If attempt unsuccessful will proceed with closure.     George Ina RN,BSN, CCM [facility] Telephonic  [phone]

## 2018-07-05 ENCOUNTER — Other Ambulatory Visit: Payer: Self-pay

## 2018-07-05 NOTE — Patient Outreach (Signed)
[facility]) Care Management  07/05/2018  KASHTEN GOWIN Jun 26, 1946 [id number]   TELEPHONE SCREENING Referral date:06/14/18 Referral source:HTA referral Referral reason:medication assistance Insurance:HTA   Telephone call to patient regarding referral.  Unable to reach patient. HIPAA compliant voice message left with call back phone number.    PLAN:  RNCM will close patient due to being unable to reach  [facility] will send close letter to patients listed primary MD.     George Ina RN,BSN, CCM [facility] Telephonic  [phone]

## 2018-07-06 DIAGNOSIS — Z23 Encounter for immunization: Secondary | ICD-10-CM | POA: Diagnosis not present

## 2018-07-06 DIAGNOSIS — Z794 Long term (current) use of insulin: Secondary | ICD-10-CM | POA: Diagnosis not present

## 2018-07-06 DIAGNOSIS — E1122 Type 2 diabetes mellitus with diabetic chronic kidney disease: Secondary | ICD-10-CM | POA: Diagnosis not present

## 2018-07-06 DIAGNOSIS — N183 Chronic kidney disease, stage 3 (moderate): Secondary | ICD-10-CM | POA: Diagnosis not present

## 2018-07-07 DIAGNOSIS — E876 Hypokalemia: Secondary | ICD-10-CM | POA: Diagnosis not present

## 2018-07-07 DIAGNOSIS — E119 Type 2 diabetes mellitus without complications: Secondary | ICD-10-CM | POA: Diagnosis not present

## 2018-07-07 DIAGNOSIS — I10 Essential (primary) hypertension: Secondary | ICD-10-CM | POA: Diagnosis not present

## 2018-07-07 DIAGNOSIS — H409 Unspecified glaucoma: Secondary | ICD-10-CM | POA: Diagnosis not present

## 2018-07-07 DIAGNOSIS — E1140 Type 2 diabetes mellitus with diabetic neuropathy, unspecified: Secondary | ICD-10-CM | POA: Diagnosis not present

## 2018-07-07 DIAGNOSIS — Z79899 Other long term (current) drug therapy: Secondary | ICD-10-CM | POA: Diagnosis not present

## 2018-07-07 DIAGNOSIS — E7800 Pure hypercholesterolemia, unspecified: Secondary | ICD-10-CM | POA: Diagnosis not present

## 2018-07-07 DIAGNOSIS — R531 Weakness: Secondary | ICD-10-CM | POA: Diagnosis not present

## 2018-07-07 DIAGNOSIS — Z794 Long term (current) use of insulin: Secondary | ICD-10-CM | POA: Diagnosis not present

## 2018-07-07 DIAGNOSIS — M109 Gout, unspecified: Secondary | ICD-10-CM | POA: Diagnosis not present

## 2018-07-07 DIAGNOSIS — H548 Legal blindness, as defined in USA: Secondary | ICD-10-CM | POA: Diagnosis not present

## 2018-07-07 DIAGNOSIS — Z888 Allergy status to other drugs, medicaments and biological substances status: Secondary | ICD-10-CM | POA: Diagnosis not present

## 2018-07-07 DIAGNOSIS — N400 Enlarged prostate without lower urinary tract symptoms: Secondary | ICD-10-CM | POA: Diagnosis not present

## 2018-07-11 ENCOUNTER — Other Ambulatory Visit: Payer: Self-pay

## 2018-07-11 DIAGNOSIS — E876 Hypokalemia: Secondary | ICD-10-CM | POA: Diagnosis not present

## 2018-07-11 NOTE — Patient Outreach (Signed)
[facility]) Care Management  07/11/2018  FARUQ ROSENBERGER [birth year] [id number]     Transition of Care Referral  Referral Date: 07/11/18 Referral Source: HTA Discharge Report Date of Admission: unknown Diagnosis: unknown Date of Discharge: 07/08/18 Facility: [facility] Insurance: HTA    Outreach attempt # 1 to patient at [phone]. A male answered and reported that patient "has not lived there in over four years." She does not know patient's location or how to contact him. RN CM attempted number [phone] and no answer.   Plan: RN CM will make outreach attempt to patient within 3-4 business days. RN CM will send unsuccessful outreach letter to patient.    Antionette Fairy, RN,BSN,CCM [facility] Care Management Telephonic Care Management Coordinator Direct Phone: [phone] Toll Free: [phone] Fax: [phone]

## 2018-07-12 ENCOUNTER — Other Ambulatory Visit: Payer: Self-pay

## 2018-07-12 NOTE — Patient Outreach (Signed)
[facility]) Care Management  07/12/2018  STAFFORD RIVIERA 1945/12/12 [id number]   Transition of Care Referral  Referral Date: 07/11/18 Referral Source: HTA Discharge Report Date of Admission: unknown Diagnosis: unknown Date of Discharge: 07/08/18 Facility: [facility] Insurance: HTA   Outreach attempt #2 to patient. RN CM able to reach patient at [phone]. Patient voices that he is doing well since return home. Patient was not very engaging in conversation and provided limited info. TOC assessment competed with patient. He voices that he went yesterday to MD office for blood work and has to make an appt with MD for follow up and to discuss lab results. He voices that he has supportive brother who is helping him out at this time and will take him to appts. Patient able to confirm that he has all his meds and denies any issues or concerns regarding them. He does not wish to review meds at this time. He denies any RN CM needs or concerns. Discussed THN services but patient declined at this time.      Plan: RN CM will close case at this time.    Antionette Fairy, RN,BSN,CCM [facility] Care Management Telephonic Care Management Coordinator Direct Phone: [phone] Toll Free: [phone] Fax: [phone]

## 2018-07-13 DIAGNOSIS — E876 Hypokalemia: Secondary | ICD-10-CM | POA: Diagnosis not present

## 2018-07-13 DIAGNOSIS — E1122 Type 2 diabetes mellitus with diabetic chronic kidney disease: Secondary | ICD-10-CM | POA: Diagnosis not present

## 2018-07-13 DIAGNOSIS — N183 Chronic kidney disease, stage 3 (moderate): Secondary | ICD-10-CM | POA: Diagnosis not present

## 2018-07-13 DIAGNOSIS — Z794 Long term (current) use of insulin: Secondary | ICD-10-CM | POA: Diagnosis not present

## 2018-07-17 DIAGNOSIS — H40151 Residual stage of open-angle glaucoma, right eye: Secondary | ICD-10-CM | POA: Diagnosis not present

## 2018-07-17 DIAGNOSIS — N183 Chronic kidney disease, stage 3 (moderate): Secondary | ICD-10-CM | POA: Diagnosis not present

## 2018-07-17 DIAGNOSIS — Z794 Long term (current) use of insulin: Secondary | ICD-10-CM | POA: Diagnosis not present

## 2018-07-17 DIAGNOSIS — E1122 Type 2 diabetes mellitus with diabetic chronic kidney disease: Secondary | ICD-10-CM | POA: Diagnosis not present

## 2018-07-17 DIAGNOSIS — E876 Hypokalemia: Secondary | ICD-10-CM | POA: Diagnosis not present

## 2018-08-09 DIAGNOSIS — H401113 Primary open-angle glaucoma, right eye, severe stage: Secondary | ICD-10-CM | POA: Diagnosis not present

## 2018-08-16 DIAGNOSIS — E876 Hypokalemia: Secondary | ICD-10-CM | POA: Diagnosis not present

## 2018-08-21 DIAGNOSIS — E1122 Type 2 diabetes mellitus with diabetic chronic kidney disease: Secondary | ICD-10-CM | POA: Diagnosis not present

## 2018-08-21 DIAGNOSIS — N183 Chronic kidney disease, stage 3 (moderate): Secondary | ICD-10-CM | POA: Diagnosis not present

## 2018-08-21 DIAGNOSIS — E876 Hypokalemia: Secondary | ICD-10-CM | POA: Diagnosis not present

## 2018-08-21 DIAGNOSIS — I129 Hypertensive chronic kidney disease with stage 1 through stage 4 chronic kidney disease, or unspecified chronic kidney disease: Secondary | ICD-10-CM | POA: Diagnosis not present

## 2018-08-21 DIAGNOSIS — Z794 Long term (current) use of insulin: Secondary | ICD-10-CM | POA: Diagnosis not present

## 2018-10-09 DIAGNOSIS — N183 Chronic kidney disease, stage 3 (moderate): Secondary | ICD-10-CM | POA: Diagnosis not present

## 2018-10-09 DIAGNOSIS — E1122 Type 2 diabetes mellitus with diabetic chronic kidney disease: Secondary | ICD-10-CM | POA: Diagnosis not present

## 2018-10-09 DIAGNOSIS — Z794 Long term (current) use of insulin: Secondary | ICD-10-CM | POA: Diagnosis not present

## 2018-10-09 DIAGNOSIS — E876 Hypokalemia: Secondary | ICD-10-CM | POA: Diagnosis not present

## 2018-10-09 DIAGNOSIS — H548 Legal blindness, as defined in USA: Secondary | ICD-10-CM | POA: Diagnosis not present

## 2018-10-09 DIAGNOSIS — I129 Hypertensive chronic kidney disease with stage 1 through stage 4 chronic kidney disease, or unspecified chronic kidney disease: Secondary | ICD-10-CM | POA: Diagnosis not present

## 2018-10-11 DIAGNOSIS — E875 Hyperkalemia: Secondary | ICD-10-CM | POA: Diagnosis not present

## 2018-10-11 DIAGNOSIS — E871 Hypo-osmolality and hyponatremia: Secondary | ICD-10-CM | POA: Diagnosis not present

## 2018-10-11 DIAGNOSIS — E119 Type 2 diabetes mellitus without complications: Secondary | ICD-10-CM | POA: Diagnosis not present

## 2018-10-18 DIAGNOSIS — E871 Hypo-osmolality and hyponatremia: Secondary | ICD-10-CM | POA: Diagnosis not present

## 2018-10-18 DIAGNOSIS — Z794 Long term (current) use of insulin: Secondary | ICD-10-CM | POA: Diagnosis not present

## 2018-10-18 DIAGNOSIS — E876 Hypokalemia: Secondary | ICD-10-CM | POA: Diagnosis not present

## 2018-10-18 DIAGNOSIS — I129 Hypertensive chronic kidney disease with stage 1 through stage 4 chronic kidney disease, or unspecified chronic kidney disease: Secondary | ICD-10-CM | POA: Diagnosis not present

## 2018-10-18 DIAGNOSIS — N183 Chronic kidney disease, stage 3 (moderate): Secondary | ICD-10-CM | POA: Diagnosis not present

## 2018-10-18 DIAGNOSIS — E1122 Type 2 diabetes mellitus with diabetic chronic kidney disease: Secondary | ICD-10-CM | POA: Diagnosis not present

## 2018-11-09 NOTE — Patient Outreach (Signed)
Late entry: On 11/09/2018 chart entered and case closure date and program fixed.   Rowe PavyAmanda Princella Jaskiewicz, RN, BSN, CEN [facility] [employer]Community Care Coordinator [phone]

## 2018-12-08 DIAGNOSIS — N183 Chronic kidney disease, stage 3 (moderate): Secondary | ICD-10-CM | POA: Diagnosis not present

## 2018-12-12 DIAGNOSIS — E871 Hypo-osmolality and hyponatremia: Secondary | ICD-10-CM | POA: Diagnosis not present

## 2018-12-12 DIAGNOSIS — N179 Acute kidney failure, unspecified: Secondary | ICD-10-CM | POA: Diagnosis not present

## 2018-12-26 DIAGNOSIS — N179 Acute kidney failure, unspecified: Secondary | ICD-10-CM | POA: Diagnosis not present

## 2018-12-28 DIAGNOSIS — N179 Acute kidney failure, unspecified: Secondary | ICD-10-CM | POA: Diagnosis not present

## 2018-12-28 DIAGNOSIS — I129 Hypertensive chronic kidney disease with stage 1 through stage 4 chronic kidney disease, or unspecified chronic kidney disease: Secondary | ICD-10-CM | POA: Diagnosis not present

## 2018-12-28 DIAGNOSIS — N183 Chronic kidney disease, stage 3 (moderate): Secondary | ICD-10-CM | POA: Diagnosis not present

## 2018-12-28 DIAGNOSIS — Z794 Long term (current) use of insulin: Secondary | ICD-10-CM | POA: Diagnosis not present

## 2018-12-28 DIAGNOSIS — E1122 Type 2 diabetes mellitus with diabetic chronic kidney disease: Secondary | ICD-10-CM | POA: Diagnosis not present

## 2018-12-28 DIAGNOSIS — E871 Hypo-osmolality and hyponatremia: Secondary | ICD-10-CM | POA: Diagnosis not present

## 2018-12-28 DIAGNOSIS — F1010 Alcohol abuse, uncomplicated: Secondary | ICD-10-CM | POA: Diagnosis not present

## 2019-02-22 ENCOUNTER — Other Ambulatory Visit: Payer: Self-pay

## 2019-03-30 DIAGNOSIS — E1122 Type 2 diabetes mellitus with diabetic chronic kidney disease: Secondary | ICD-10-CM | POA: Diagnosis not present

## 2019-03-30 DIAGNOSIS — Z794 Long term (current) use of insulin: Secondary | ICD-10-CM | POA: Diagnosis not present

## 2019-03-30 DIAGNOSIS — N183 Chronic kidney disease, stage 3 (moderate): Secondary | ICD-10-CM | POA: Diagnosis not present

## 2019-04-02 DIAGNOSIS — E871 Hypo-osmolality and hyponatremia: Secondary | ICD-10-CM | POA: Diagnosis not present

## 2019-04-02 DIAGNOSIS — N183 Chronic kidney disease, stage 3 (moderate): Secondary | ICD-10-CM | POA: Diagnosis not present

## 2019-04-02 DIAGNOSIS — Z794 Long term (current) use of insulin: Secondary | ICD-10-CM | POA: Diagnosis not present

## 2019-04-02 DIAGNOSIS — E1122 Type 2 diabetes mellitus with diabetic chronic kidney disease: Secondary | ICD-10-CM | POA: Diagnosis not present

## 2019-04-02 DIAGNOSIS — I129 Hypertensive chronic kidney disease with stage 1 through stage 4 chronic kidney disease, or unspecified chronic kidney disease: Secondary | ICD-10-CM | POA: Diagnosis not present

## 2019-04-17 DIAGNOSIS — N183 Chronic kidney disease, stage 3 (moderate): Secondary | ICD-10-CM | POA: Diagnosis not present

## 2019-04-17 DIAGNOSIS — E1122 Type 2 diabetes mellitus with diabetic chronic kidney disease: Secondary | ICD-10-CM | POA: Diagnosis not present

## 2019-04-17 DIAGNOSIS — Z794 Long term (current) use of insulin: Secondary | ICD-10-CM | POA: Diagnosis not present

## 2019-04-17 DIAGNOSIS — N401 Enlarged prostate with lower urinary tract symptoms: Secondary | ICD-10-CM | POA: Diagnosis not present

## 2019-04-17 DIAGNOSIS — E871 Hypo-osmolality and hyponatremia: Secondary | ICD-10-CM | POA: Diagnosis not present

## 2019-04-17 DIAGNOSIS — I129 Hypertensive chronic kidney disease with stage 1 through stage 4 chronic kidney disease, or unspecified chronic kidney disease: Secondary | ICD-10-CM | POA: Diagnosis not present

## 2019-04-17 DIAGNOSIS — R3911 Hesitancy of micturition: Secondary | ICD-10-CM | POA: Diagnosis not present

## 2019-05-21 DIAGNOSIS — H409 Unspecified glaucoma: Secondary | ICD-10-CM

## 2019-05-21 DIAGNOSIS — I10 Essential (primary) hypertension: Secondary | ICD-10-CM | POA: Diagnosis not present

## 2019-05-21 DIAGNOSIS — N400 Enlarged prostate without lower urinary tract symptoms: Secondary | ICD-10-CM | POA: Diagnosis not present

## 2019-05-21 DIAGNOSIS — E871 Hypo-osmolality and hyponatremia: Secondary | ICD-10-CM

## 2019-05-21 DIAGNOSIS — E119 Type 2 diabetes mellitus without complications: Secondary | ICD-10-CM | POA: Diagnosis not present

## 2019-05-21 DIAGNOSIS — E860 Dehydration: Secondary | ICD-10-CM | POA: Diagnosis not present

## 2019-05-21 DIAGNOSIS — E875 Hyperkalemia: Secondary | ICD-10-CM

## 2019-05-22 DIAGNOSIS — E860 Dehydration: Secondary | ICD-10-CM | POA: Diagnosis not present

## 2019-05-22 DIAGNOSIS — N400 Enlarged prostate without lower urinary tract symptoms: Secondary | ICD-10-CM | POA: Diagnosis not present

## 2019-05-22 DIAGNOSIS — E119 Type 2 diabetes mellitus without complications: Secondary | ICD-10-CM | POA: Diagnosis not present

## 2019-05-22 DIAGNOSIS — E871 Hypo-osmolality and hyponatremia: Secondary | ICD-10-CM

## 2019-05-22 DIAGNOSIS — I10 Essential (primary) hypertension: Secondary | ICD-10-CM | POA: Diagnosis not present

## 2019-05-23 DIAGNOSIS — N400 Enlarged prostate without lower urinary tract symptoms: Secondary | ICD-10-CM | POA: Diagnosis not present

## 2019-05-23 DIAGNOSIS — I10 Essential (primary) hypertension: Secondary | ICD-10-CM | POA: Diagnosis not present

## 2019-05-23 DIAGNOSIS — E119 Type 2 diabetes mellitus without complications: Secondary | ICD-10-CM | POA: Diagnosis not present

## 2019-05-23 DIAGNOSIS — E860 Dehydration: Secondary | ICD-10-CM | POA: Diagnosis not present

## 2019-05-24 DIAGNOSIS — I10 Essential (primary) hypertension: Secondary | ICD-10-CM | POA: Diagnosis not present

## 2019-05-24 DIAGNOSIS — E860 Dehydration: Secondary | ICD-10-CM | POA: Diagnosis not present

## 2019-05-24 DIAGNOSIS — E119 Type 2 diabetes mellitus without complications: Secondary | ICD-10-CM | POA: Diagnosis not present

## 2019-05-24 DIAGNOSIS — N400 Enlarged prostate without lower urinary tract symptoms: Secondary | ICD-10-CM | POA: Diagnosis not present

## 2019-05-25 DIAGNOSIS — I10 Essential (primary) hypertension: Secondary | ICD-10-CM | POA: Diagnosis not present

## 2019-05-25 DIAGNOSIS — E860 Dehydration: Secondary | ICD-10-CM | POA: Diagnosis not present

## 2019-05-25 DIAGNOSIS — E119 Type 2 diabetes mellitus without complications: Secondary | ICD-10-CM | POA: Diagnosis not present

## 2019-05-25 DIAGNOSIS — N400 Enlarged prostate without lower urinary tract symptoms: Secondary | ICD-10-CM | POA: Diagnosis not present

## 2019-05-26 DIAGNOSIS — E860 Dehydration: Secondary | ICD-10-CM | POA: Diagnosis not present

## 2019-05-26 DIAGNOSIS — E119 Type 2 diabetes mellitus without complications: Secondary | ICD-10-CM | POA: Diagnosis not present

## 2019-05-26 DIAGNOSIS — I10 Essential (primary) hypertension: Secondary | ICD-10-CM | POA: Diagnosis not present

## 2019-05-26 DIAGNOSIS — N400 Enlarged prostate without lower urinary tract symptoms: Secondary | ICD-10-CM | POA: Diagnosis not present

## 2019-05-27 DIAGNOSIS — E860 Dehydration: Secondary | ICD-10-CM | POA: Diagnosis not present

## 2019-05-27 DIAGNOSIS — N400 Enlarged prostate without lower urinary tract symptoms: Secondary | ICD-10-CM | POA: Diagnosis not present

## 2019-05-27 DIAGNOSIS — I10 Essential (primary) hypertension: Secondary | ICD-10-CM | POA: Diagnosis not present

## 2019-05-27 DIAGNOSIS — E119 Type 2 diabetes mellitus without complications: Secondary | ICD-10-CM | POA: Diagnosis not present

## 2019-05-28 DIAGNOSIS — N179 Acute kidney failure, unspecified: Secondary | ICD-10-CM | POA: Diagnosis not present

## 2019-05-28 DIAGNOSIS — N400 Enlarged prostate without lower urinary tract symptoms: Secondary | ICD-10-CM | POA: Diagnosis not present

## 2019-05-28 DIAGNOSIS — I10 Essential (primary) hypertension: Secondary | ICD-10-CM | POA: Diagnosis not present

## 2019-05-28 DIAGNOSIS — E875 Hyperkalemia: Secondary | ICD-10-CM | POA: Diagnosis not present

## 2020-04-01 DIAGNOSIS — E1110 Type 2 diabetes mellitus with ketoacidosis without coma: Secondary | ICD-10-CM

## 2020-04-01 DIAGNOSIS — N179 Acute kidney failure, unspecified: Secondary | ICD-10-CM

## 2020-04-01 DIAGNOSIS — I10 Essential (primary) hypertension: Secondary | ICD-10-CM

## 2020-04-02 DIAGNOSIS — N179 Acute kidney failure, unspecified: Secondary | ICD-10-CM | POA: Diagnosis not present

## 2020-04-02 DIAGNOSIS — I10 Essential (primary) hypertension: Secondary | ICD-10-CM | POA: Diagnosis not present

## 2020-04-02 DIAGNOSIS — E1110 Type 2 diabetes mellitus with ketoacidosis without coma: Secondary | ICD-10-CM | POA: Diagnosis not present

## 2020-06-04 DEATH — deceased
